# Patient Record
Sex: Male | Born: 1990 | Race: Black or African American | Hispanic: No | Marital: Single | Smoking: Current every day smoker
Health system: Southern US, Community
[De-identification: ages and names within clinical notes are randomized; demographics above are authoritative.]

---
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
## 2011-05-11 ENCOUNTER — Emergency Department: Payer: Self-pay | Admitting: Emergency Medicine

## 2014-06-13 ENCOUNTER — Emergency Department: Payer: Self-pay | Admitting: Emergency Medicine

## 2014-06-16 ENCOUNTER — Emergency Department: Payer: Self-pay | Admitting: Emergency Medicine

## 2014-07-13 ENCOUNTER — Emergency Department: Payer: Self-pay | Admitting: Emergency Medicine

## 2014-12-07 ENCOUNTER — Emergency Department: Admit: 2014-12-07 | Discharge status: Against Medical Advice | Payer: Self-pay | Admitting: Emergency Medicine

## 2014-12-07 LAB — CBC
HCT: 40.7 % (ref 40.0–52.0)
HGB: 13.1 g/dL (ref 13.0–18.0)
MCH: 26.9 pg (ref 26.0–34.0)
MCHC: 32.3 g/dL (ref 32.0–36.0)
MCV: 83 fL (ref 80–100)
PLATELETS: 297 10*3/uL (ref 150–440)
RBC: 4.88 10*6/uL (ref 4.40–5.90)
RDW: 14 % (ref 11.5–14.5)
WBC: 10 10*3/uL (ref 3.8–10.6)

## 2014-12-07 LAB — BASIC METABOLIC PANEL
ANION GAP: 5 — AB (ref 7–16)
BUN: 8 mg/dL
Calcium, Total: 9.1 mg/dL
Chloride: 104 mmol/L
Co2: 29 mmol/L
Creatinine: 0.93 mg/dL
EGFR (Non-African Amer.): >60
Glucose: 85 mg/dL
Potassium: 3.2 mmol/L — ABNORMAL LOW
SODIUM: 138 mmol/L

## 2014-12-07 LAB — TROPONIN I: Troponin-I: <0.03 ng/mL

## 2016-09-11 ENCOUNTER — Emergency Department
Admission: EM | Admit: 2016-09-11 | Discharge: 2016-09-11 | Disposition: A | Discharge status: Home / Self Care | Payer: Self-pay | Attending: Emergency Medicine | Admitting: Emergency Medicine

## 2016-09-11 DIAGNOSIS — Z5321 Procedure and treatment not carried out due to patient leaving prior to being seen by health care provider: Secondary | ICD-10-CM | POA: Insufficient documentation

## 2016-12-26 ENCOUNTER — Emergency Department
Admission: EM | Admit: 2016-12-26 | Discharge: 2016-12-26 | Disposition: A | Discharge status: Home / Self Care | Payer: Self-pay | Attending: Emergency Medicine | Admitting: Emergency Medicine

## 2016-12-26 ENCOUNTER — Encounter: Payer: Self-pay | Admitting: Emergency Medicine

## 2016-12-26 DIAGNOSIS — K219 Gastro-esophageal reflux disease without esophagitis: Secondary | ICD-10-CM | POA: Insufficient documentation

## 2016-12-26 MED ORDER — GI COCKTAIL ~~LOC~~
30.0000 mL | Freq: Once | ORAL | Status: AC
  Administered 2016-12-26: 30 mL via ORAL
  Filled 2016-12-26: qty 30

## 2016-12-26 MED ORDER — FAMOTIDINE 20 MG PO TABS: 20.0000 mg | ORAL_TABLET | Freq: Every day | ORAL | 0 refills | Status: DC

## 2016-12-26 NOTE — ED Triage Notes (Signed)
Pt reports nausea and heartburn that began this morning. Pt reports diarrhea three times today. No apparent distress noted in triage.

## 2016-12-26 NOTE — ED Notes (Signed)

## 2016-12-26 NOTE — ED Provider Notes (Signed)
[facility] Emergency Department Provider Note   ____________________________________________   First MD Initiated Contact with Patient 12/26/16 1858     (approximate)  I have reviewed the triage vital signs and the nursing notes.   HISTORY  Chief Complaint Nausea and Heartburn    HPI ARBOR COHEN is a 26 y.o. male patient complaining of epigastric pain that began this morning. Patient described the pain as "burning". Patient state there is nausea but no vomiting. Patient also noticed loose stools 3 episodes today. No palliative measures taken for this complaint. Patient rates his pain as a 4/10.patient admits to eating leftover pizza last night before retiring to bed.  History reviewed. No pertinent past medical history.  There are no active problems to display for this patient.   No past surgical history on file.  Prior to Admission medications   Medication Sig Start Date End Date Taking? Authorizing Provider  famotidine (PEPCID) 20 MG tablet Take 1 tablet (20 mg total) by mouth daily. 12/26/16 12/26/17  Joni Reining, PA-C    Allergies Patient has no known allergies.  No family history on file.  Social History Social History  Substance Use Topics  . Smoking status: Not on file  . Smokeless tobacco: Not on file  . Alcohol use Not on file    Review of Systems  Constitutional: No fever/chills Eyes: No visual changes. ENT: No sore throat. Cardiovascular: Denies chest pain. Respiratory: Denies shortness of breath. Gastrointestinal: epigastric pain. Nausea without vomiting.  3 episodes of loose stools. Genitourinary: Negative for dysuria. Musculoskeletal: Negative for back pain. Skin: Negative for rash. Neurological: Negative for headaches, focal weakness or numbness.   ____________________________________________   PHYSICAL EXAM:  VITAL SIGNS: ED Triage Vitals  Enc Vitals Group     BP 12/26/16 1837 (!) 178/81     Pulse  Rate 12/26/16 1837 69     Resp 12/26/16 1837 18     Temp 12/26/16 1837 98.7 F (37.1 C)     Temp Source 12/26/16 1837 Oral     SpO2 12/26/16 1837 100 %     Weight 12/26/16 1838 155 lb (70.3 kg)     Height 12/26/16 1838 5\' 9"  (1.753 m)     Head Circumference --      Peak Flow --      Pain Score 12/26/16 1837 4     Pain Loc --      Pain Edu? --      Excl. in GC? --     Constitutional: Alert and oriented. Well appearing and in no acute distress. Mouth/Throat: Mucous membranes are moist.  Oropharynx non-erythematous. Cardiovascular: Normal rate, regular rhythm. Grossly normal heart sounds.  Good peripheral circulation. Elevated blood pressure Respiratory: Normal respiratory effort.  No retractions. Lungs CTAB. Gastrointestinal: Soft and nontender. No distention. Hyperactive bowel sounds. Neurologic:  Normal speech and language. No gross focal neurologic deficits are appreciated. No gait instability. Skin:  Skin is warm, dry and intact. No rash noted. Psychiatric: Mood and affect are normal. Speech and behavior are normal.  ____________________________________________   LABS (all labs ordered are listed, but only abnormal results are displayed)  Labs Reviewed - No data to display ____________________________________________  EKG   ____________________________________________  RADIOLOGY   ____________________________________________   PROCEDURES  Procedure(s) performed: None  Procedures  Critical Care performed: No  ____________________________________________   INITIAL IMPRESSION / ASSESSMENT AND PLAN / ED COURSE  Pertinent labs & imaging results that were available during my care of  the patient were reviewed by me and considered in my medical decision making (see chart for details).  Gastric reflux. Patient given discharge Instructions. Patient given a work note. [facility].       ____________________________________________   FINAL CLINICAL IMPRESSION(S) / ED DIAGNOSES  Final diagnoses:  Gastroesophageal reflux disease without esophagitis      NEW MEDICATIONS STARTED DURING THIS VISIT:  New Prescriptions   FAMOTIDINE (PEPCID) 20 MG TABLET    Take 1 tablet (20 mg total) by mouth daily.     Note:  This document was prepared using Dragon voice recognition software and may include unintentional dictation errors.    Joni ReiningSmith, Ronald K, PA-C 12/26/16 1918    Loleta RoseForbach, Cory, MD 12/26/16 2211

## 2017-02-05 ENCOUNTER — Emergency Department
Admission: EM | Admit: 2017-02-05 | Discharge: 2017-02-05 | Disposition: A | Discharge status: Home / Self Care | Payer: Self-pay | Attending: Emergency Medicine | Admitting: Emergency Medicine

## 2017-02-05 DIAGNOSIS — S51819A Laceration without foreign body of unspecified forearm, initial encounter: Secondary | ICD-10-CM

## 2017-02-05 DIAGNOSIS — Y999 Unspecified external cause status: Secondary | ICD-10-CM | POA: Insufficient documentation

## 2017-02-05 DIAGNOSIS — S51811A Laceration without foreign body of right forearm, initial encounter: Secondary | ICD-10-CM | POA: Insufficient documentation

## 2017-02-05 DIAGNOSIS — Z23 Encounter for immunization: Secondary | ICD-10-CM | POA: Insufficient documentation

## 2017-02-05 DIAGNOSIS — Y939 Activity, unspecified: Secondary | ICD-10-CM | POA: Insufficient documentation

## 2017-02-05 DIAGNOSIS — S51822A Laceration with foreign body of left forearm, initial encounter: Secondary | ICD-10-CM | POA: Insufficient documentation

## 2017-02-05 DIAGNOSIS — F17200 Nicotine dependence, unspecified, uncomplicated: Secondary | ICD-10-CM | POA: Insufficient documentation

## 2017-02-05 DIAGNOSIS — Y929 Unspecified place or not applicable: Secondary | ICD-10-CM | POA: Insufficient documentation

## 2017-02-05 MED ORDER — LIDOCAINE HCL (PF) 1 % IJ SOLN
5.0000 mL | Freq: Once | INTRAMUSCULAR | Status: AC
  Administered 2017-02-05: 5 mL via INTRADERMAL
  Filled 2017-02-05: qty 5

## 2017-02-05 MED ORDER — TETANUS-DIPHTH-ACELL PERTUSSIS 5-2.5-18.5 LF-MCG/0.5 IM SUSP
0.5000 mL | Freq: Once | INTRAMUSCULAR | Status: AC
  Administered 2017-02-05: 0.5 mL via INTRAMUSCULAR
  Filled 2017-02-05: qty 0.5

## 2017-02-05 MED ORDER — DOXYCYCLINE HYCLATE 50 MG PO CAPS: 50.0000 mg | ORAL_CAPSULE | Freq: Two times a day (BID) | ORAL | 0 refills | Status: DC

## 2017-02-05 NOTE — ED Notes (Signed)
[employer] at bedside 

## 2017-02-05 NOTE — Discharge Instructions (Signed)
Please seek medical attention for any high fevers, chest pain, shortness of breath, change in behavior, persistent vomiting, bloody stool or any other new or concerning symptoms.  

## 2017-02-05 NOTE — ED Notes (Addendum)
ED Provider at bedside.. MD Archie Balboa with suture kit for R arm
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
## 2017-02-05 NOTE — ED Notes (Signed)
ED Provider at bedside. MD Archie Balboa w/ suture kit for L arm

## 2017-11-05 ENCOUNTER — Emergency Department
Admission: EM | Admit: 2017-11-05 | Discharge: 2017-11-05 | Disposition: A | Discharge status: Home / Self Care | Payer: Self-pay | Attending: Emergency Medicine | Admitting: Emergency Medicine

## 2017-11-05 ENCOUNTER — Encounter: Payer: Self-pay | Admitting: Emergency Medicine

## 2017-11-05 ENCOUNTER — Other Ambulatory Visit: Payer: Self-pay

## 2017-11-05 DIAGNOSIS — F17200 Nicotine dependence, unspecified, uncomplicated: Secondary | ICD-10-CM | POA: Insufficient documentation

## 2017-11-05 DIAGNOSIS — J0100 Acute maxillary sinusitis, unspecified: Secondary | ICD-10-CM | POA: Insufficient documentation

## 2017-11-05 DIAGNOSIS — J069 Acute upper respiratory infection, unspecified: Secondary | ICD-10-CM | POA: Insufficient documentation

## 2017-11-05 LAB — INFLUENZA PANEL BY PCR (TYPE A & B)
INFLAPCR: NEGATIVE
Influenza B By PCR: NEGATIVE

## 2017-11-05 MED ORDER — AZITHROMYCIN 250 MG PO TABS: ORAL_TABLET | ORAL | 0 refills | Status: DC

## 2017-11-05 NOTE — ED Notes (Signed)
See triage note  States he developed some body aches and subjective fever at home  Afebrile on arrival

## 2017-11-05 NOTE — ED Provider Notes (Signed)
[facility] Emergency Department Provider Note  ____________________________________________   First MD Initiated Contact with Patient 11/05/17 1543     (approximate)  I have reviewed the triage vital signs and the nursing notes.   HISTORY  Chief Complaint No chief complaint on file.    HPI Thomas Rowe is a 27 y.o. male complains of body aches and fever for 1 at home.  He states he aches all over.  Symptoms for about 3 days.  He [facility] and told he did not have the flu or strep but since then he has become hot and cold.  He denies any vomiting or diarrhea.  Denies chest pain or shortness of breath  History reviewed. No pertinent past medical history.  There are no active problems to display for this patient.   History reviewed. No pertinent surgical history.  Prior to Admission medications   Medication Sig Start Date End Date Taking? Authorizing Provider  azithromycin (ZITHROMAX Z-PAK) 250 MG tablet 2 pills today then 1 pill a day for 4 days 11/05/17   Faythe GheeFisher, Nixon Kolton W, PA-C    Allergies Patient has no known allergies.  No family history on file.  Social History Social History   Tobacco Use  . Smoking status: Current Every Day Smoker    Packs/day: 0.50  . Smokeless tobacco: Never Used  Substance Use Topics  . Alcohol use: Never    Frequency: Never  . Drug use: Never    Review of Systems  Constitutional: Positive fever/chills Eyes: No visual changes. ENT: Positive sore throat.  Positive for sinus congestion with clear to bloody mucus Respiratory: Mild cough Gastrointestinal: Denies vomiting or diarrhea Genitourinary: Negative for dysuria. Musculoskeletal: Negative for back pain. Skin: Negative for rash.    ____________________________________________   PHYSICAL EXAM:  VITAL SIGNS: ED Triage Vitals  Enc Vitals Group     BP 11/05/17 1523 135/71     Pulse Rate 11/05/17 1523 99     Resp 11/05/17 1523 16      Temp 11/05/17 1523 98.7 F (37.1 C)     Temp Source 11/05/17 1523 Oral     SpO2 11/05/17 1523 100 %     Weight 11/05/17 1524 160 lb (72.6 kg)     Height 11/05/17 1524 5\' 9"  (1.753 m)     Head Circumference --      Peak Flow --      Pain Score 11/05/17 1523 4     Pain Loc --      Pain Edu? --      Excl. in GC? --     Constitutional: Alert and oriented. Well appearing and in no acute distress. Eyes: Conjunctivae are normal.  Head: Atraumatic. Ears: TMs are clear bilaterally Nose: No congestion/rhinnorhea. Mouth/Throat: Mucous membranes are moist.  Throat is normal.  There is no redness or pus noted  Cardiovascular: Normal rate, regular rhythm.  Heart sounds are normal Respiratory: Normal respiratory effort.  No retractions, lungs clear to auscultation GU: deferred Musculoskeletal: FROM all extremities, warm and well perfused Neurologic:  Normal speech and language.  Skin:  Skin is warm, dry and intact. No rash noted. Psychiatric: Mood and affect are normal. Speech and behavior are normal.  ____________________________________________   LABS (all labs ordered are listed, but only abnormal results are displayed)  Labs Reviewed  INFLUENZA PANEL BY PCR (TYPE A & B)   ____________________________________________   ____________________________________________  RADIOLOGY    ____________________________________________   PROCEDURES  Procedure(s) performed: No  Procedures    ____________________________________________   INITIAL IMPRESSION / ASSESSMENT AND PLAN / ED COURSE  Pertinent labs & imaging results that were available during my care of the patient were reviewed by me and considered in my medical decision making (see chart for details).  Patient is a 27 year old male presents emergency department complaining of flulike symptoms.  Physical exam he appears well.  He is afebrile at this time.  Flu test ordered by nursing staff   Flu test is  negative.   Test results are explained to the patient.  He was given a diagnosis of acute upper respiratory and sinusitis.  Given a prescription for Z-Pak.  A work note which seem to be mostly important to him.  And he was discharged in stable condition  As part of my medical decision making, I reviewed the following data within the electronic MEDICAL RECORD NUMBER Nursing notes reviewed and incorporated, Labs reviewed flu test is negative, Notes from prior ED visits and [place] Controlled Substance Database  ____________________________________________   FINAL CLINICAL IMPRESSION(S) / ED DIAGNOSES  Final diagnoses:  Acute URI  Acute maxillary sinusitis, recurrence not specified      NEW MEDICATIONS STARTED DURING THIS VISIT:  Discharge Medication List as of 11/05/2017  4:14 PM    START taking these medications   Details  azithromycin (ZITHROMAX Z-PAK) 250 MG tablet 2 pills today then 1 pill a day for 4 days, Print         Note:  This document was prepared using Dragon voice recognition software and may include unintentional dictation errors.    Faythe Ghee, PA-C 11/05/17 1721    Phineas Semen, MD 11/05/17 [phone]

## 2017-11-05 NOTE — ED Triage Notes (Addendum)
PT to ED via POV with c/o body aches and fevers at home x3days. Pt ambulatory, VSS

## 2017-11-12 ENCOUNTER — Emergency Department: Payer: Self-pay

## 2017-11-12 ENCOUNTER — Other Ambulatory Visit: Payer: Self-pay

## 2017-11-12 ENCOUNTER — Encounter: Payer: Self-pay | Admitting: Emergency Medicine

## 2017-11-12 ENCOUNTER — Emergency Department
Admission: EM | Admit: 2017-11-12 | Discharge: 2017-11-12 | Disposition: A | Discharge status: Home / Self Care | Payer: Self-pay | Attending: Emergency Medicine | Admitting: Emergency Medicine

## 2017-11-12 DIAGNOSIS — Z5321 Procedure and treatment not carried out due to patient leaving prior to being seen by health care provider: Secondary | ICD-10-CM | POA: Insufficient documentation

## 2017-11-12 DIAGNOSIS — R079 Chest pain, unspecified: Secondary | ICD-10-CM | POA: Insufficient documentation

## 2017-11-12 DIAGNOSIS — R0602 Shortness of breath: Secondary | ICD-10-CM | POA: Insufficient documentation

## 2017-11-12 LAB — CBC
HCT: 38.8 % — ABNORMAL LOW (ref 40.0–52.0)
Hemoglobin: 12.7 g/dL — ABNORMAL LOW (ref 13.0–18.0)
MCH: 26.6 pg (ref 26.0–34.0)
MCHC: 32.8 g/dL (ref 32.0–36.0)
MCV: 81.1 fL (ref 80.0–100.0)
Platelets: 451 10*3/uL — ABNORMAL HIGH (ref 150–440)
RBC: 4.79 MIL/uL (ref 4.40–5.90)
RDW: 13.2 % (ref 11.5–14.5)
WBC: 13.2 10*3/uL — ABNORMAL HIGH (ref 3.8–10.6)

## 2017-11-12 LAB — BASIC METABOLIC PANEL
ANION GAP: 10 (ref 5–15)
BUN: 7 mg/dL (ref 6–20)
CO2: 23 mmol/L (ref 22–32)
Calcium: 9.4 mg/dL (ref 8.9–10.3)
Chloride: 107 mmol/L (ref 101–111)
Creatinine, Ser: 1.02 mg/dL (ref 0.61–1.24)
GFR calc Af Amer: >60 mL/min (ref >60)
GFR calc non Af Amer: >60 mL/min (ref >60)
GLUCOSE: 104 mg/dL — AB (ref 65–99)
POTASSIUM: 3.1 mmol/L — AB (ref 3.5–5.1)
Sodium: 140 mmol/L (ref 135–145)

## 2017-11-12 LAB — TROPONIN I: Troponin I: <0.03 ng/mL (ref <0.03)

## 2017-11-12 NOTE — ED Triage Notes (Signed)
Pt arrived to the ED for complaints of chest pain and coughing. Pt reports that he was at work when he began to cough uncontrollably which made him start having chest pain/tighness with a tingly sensation on his fingers. Pt states that it went away and it returned, that is when he decided to come to the ED. Pt is AOx4 during triage, Pt appears to be anxious. MD was notified.

## 2017-11-13 ENCOUNTER — Telehealth: Payer: Self-pay | Admitting: Emergency Medicine

## 2017-11-13 NOTE — Telephone Encounter (Signed)
Called patient due to lwot to inquire about condition and follow up plans. Left message with his father.

## 2017-12-20 ENCOUNTER — Emergency Department: Payer: Self-pay

## 2017-12-20 ENCOUNTER — Emergency Department
Admission: EM | Admit: 2017-12-20 | Discharge: 2017-12-20 | Disposition: A | Discharge status: Home / Self Care | Payer: Self-pay | Attending: Emergency Medicine | Admitting: Emergency Medicine

## 2017-12-20 ENCOUNTER — Other Ambulatory Visit: Payer: Self-pay

## 2017-12-20 DIAGNOSIS — M545 Low back pain, unspecified: Secondary | ICD-10-CM

## 2017-12-20 DIAGNOSIS — F17200 Nicotine dependence, unspecified, uncomplicated: Secondary | ICD-10-CM | POA: Insufficient documentation

## 2017-12-20 MED ORDER — ETODOLAC 200 MG PO CAPS: 200.0000 mg | ORAL_CAPSULE | Freq: Three times a day (TID) | ORAL | 0 refills | Status: DC

## 2017-12-20 MED ORDER — KETOROLAC TROMETHAMINE 60 MG/2ML IM SOLN
60.0000 mg | Freq: Once | INTRAMUSCULAR | Status: AC
  Administered 2017-12-20: 60 mg via INTRAMUSCULAR
  Filled 2017-12-20: qty 2

## 2017-12-20 MED ORDER — LIDOCAINE 5 % EX PTCH: 1.0000 | MEDICATED_PATCH | Freq: Two times a day (BID) | CUTANEOUS | 0 refills | Status: AC

## 2017-12-20 NOTE — ED Provider Notes (Signed)
[facility] Emergency Department Provider Note   ____________________________________________   First MD Initiated Contact with Patient 12/20/17 [phone]     (approximate)  I have reviewed the triage vital signs and the nursing notes.   HISTORY  Chief Complaint Back Pain    HPI Thomas Rowe is a 27 y.o. male who comes into the hospital today with some back pain.  He reports that his back is sore when he is walking.  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  He reports that his back is been hurting all day and is been getting worse and worse as the day wore on.  He has not taken anything for pain.  He reports that the pain is in his right lower back and extends to the middle.  He states that his legs are always weak but they are not more weak than normal.  He also denies any tingling or other symptoms.  The patient rates his pain a 5 out of 10 in intensity.  He is here for evaluation.  The patient has no fecal incontinence or urinary retention  History reviewed. No pertinent past medical history.  There are no active problems to display for this patient.   History reviewed. No pertinent surgical history.  Prior to Admission medications   Medication Sig Start Date End Date Taking? Authorizing Provider  azithromycin (ZITHROMAX Z-PAK) 250 MG tablet 2 pills today then 1 pill a day for 4 days 11/05/17   Sherrie Mustache Roselyn Bering, PA-C  etodolac (LODINE) 200 MG capsule Take 1 capsule (200 mg total) by mouth every 8 (eight) hours. 12/20/17   Rebecka Apley, MD  lidocaine (LIDODERM) 5 % Place 1 patch onto the skin every 12 (twelve) hours. Remove & Discard patch within 12 hours or as directed by MD 12/20/17 12/20/18  Rebecka Apley, MD    Allergies Patient has no known allergies.  No family history on file.  Social History Social History   Tobacco Use  . Smoking status: Current Every Day Smoker   Packs/day: 0.50  . Smokeless tobacco: Never Used  Substance Use Topics  . Alcohol use: Never    Frequency: Never  . Drug use: Never    Review of Systems  Constitutional: No fever/chills Eyes: No visual changes. ENT: No sore throat. Cardiovascular: Denies chest pain. Respiratory: Denies shortness of breath. Gastrointestinal: No abdominal pain.  No nausea, no vomiting.   Genitourinary: Negative for dysuria. Musculoskeletal:  back pain. Skin: Negative for rash. Neurological: Negative for headaches, focal weakness or numbness.   ____________________________________________   PHYSICAL EXAM:  VITAL SIGNS: ED Triage Vitals [12/20/17 0429]  Enc Vitals Group     BP (!) 150/88     Pulse Rate (!) 56     Resp 18     Temp 98.1 F (36.7 C)     Temp Source Oral     SpO2 100 %     Weight 160 lb (72.6 kg)     Height      Head Circumference      Peak Flow      Pain Score 5     Pain Loc      Pain Edu?      Excl. in GC?     Constitutional: Alert and oriented. Well appearing and in mild distress. Eyes: Conjunctivae are normal. PERRL. EOMI. Head: Atraumatic. Nose: No congestion/rhinnorhea. Mouth/Throat: Mucous membranes  are moist.  Oropharynx non-erythematous. Cardiovascular: Normal rate, regular rhythm. Grossly normal heart sounds.  Good peripheral circulation. Respiratory: Normal respiratory effort.  No retractions. Lungs CTAB. Gastrointestinal: Soft and nontender. No distention.  Musculoskeletal: Tenderness to palpation of right back and midline, negative straight leg raise Neurologic:  Normal speech and language.  Skin:  Skin is warm, dry and intact.  Psychiatric: Mood and affect are normal. .  ____________________________________________   LABS (all labs ordered are listed, but only abnormal results are displayed)  Labs Reviewed - No data to  display ____________________________________________  EKG  none ____________________________________________  RADIOLOGY  ED MD interpretation: DG lumbar spine: Negative  Official radiology report(s): Dg Lumbar Spine 2-3 Views  Result Date: 12/20/2017 CLINICAL DATA:  27 year old male with back pain. EXAM: LUMBAR SPINE - 2-3 VIEW COMPARISON:  None. FINDINGS: There is no evidence of lumbar spine fracture. Alignment is normal. Intervertebral disc spaces are maintained. IMPRESSION: Negative. Electronically Signed   By: Elgie Collard M.D.   On: 12/20/2017 05:19    ____________________________________________   PROCEDURES  Procedure(s) performed: None  Procedures  Critical Care performed: No  ____________________________________________   INITIAL IMPRESSION / ASSESSMENT AND PLAN / ED COURSE  As part of my medical decision making, I reviewed the following data within the electronic MEDICAL RECORD NUMBER Notes from prior ED visits and [place] Controlled Substance Database   This is a 27 year old male who comes into the hospital today with some right-sided back pain.  The patient states that he has been lifting heavy things at work.  I will give the patient a shot of Toradol and send him for an x-ray.  I feel that the patient likely has some musculoskeletal pain but given him midline pain I will send him for an x-ray.  He will be reassessed.  The patient's x-ray is negative.  He will be discharged home to follow-up with the acute care clinic.      ____________________________________________   FINAL CLINICAL IMPRESSION(S) / ED DIAGNOSES  Final diagnoses:  Acute right-sided low back pain without sciatica     ED Discharge Orders        Ordered    etodolac (LODINE) 200 MG capsule  Every 8 hours     12/20/17 0530    lidocaine (LIDODERM) 5 %  Every 12 hours     12/20/17 0530       Note:  This document was prepared using Dragon voice recognition software and may include  unintentional dictation errors.    Rebecka Apley, MD 12/20/17 [phone]

## 2017-12-20 NOTE — ED Triage Notes (Signed)
Patient c/o mid/lower right back pain beginning at work this evening while lifting heavy objects. Patient reports he does not want to file worker's comp paperwork.

## 2017-12-20 NOTE — ED Notes (Signed)
Patient ambulatory to lobby with steady gait and NAD noted. Verbalized understanding of discharge instructions and follow-up care.  

## 2017-12-20 NOTE — Discharge Instructions (Addendum)
Please follow up at the acute care clinic for further evaluation of your back pain.

## 2018-05-06 ENCOUNTER — Other Ambulatory Visit: Payer: Self-pay

## 2018-05-06 ENCOUNTER — Encounter: Payer: Self-pay | Admitting: *Deleted

## 2018-05-06 DIAGNOSIS — R42 Dizziness and giddiness: Secondary | ICD-10-CM | POA: Insufficient documentation

## 2018-05-06 DIAGNOSIS — Z5321 Procedure and treatment not carried out due to patient leaving prior to being seen by health care provider: Secondary | ICD-10-CM | POA: Insufficient documentation

## 2018-05-06 NOTE — ED Triage Notes (Signed)
Pt ambulatory to triage.  Pt reports left ear feels clogged up and he is dizzy.  No earache.  No chest pain.  Pt also reports diff taking a deep breath.  No cough.  cig smoker.  Pt alert.

## 2018-05-07 ENCOUNTER — Emergency Department
Admission: EM | Admit: 2018-05-07 | Discharge: 2018-05-07 | Disposition: A | Discharge status: Home / Self Care | Payer: Self-pay | Attending: Emergency Medicine | Admitting: Emergency Medicine

## 2018-05-07 LAB — CBC
HCT: 37.9 % — ABNORMAL LOW (ref 40.0–52.0)
HEMOGLOBIN: 12.7 g/dL — AB (ref 13.0–18.0)
MCH: 27.8 pg (ref 26.0–34.0)
MCHC: 33.4 g/dL (ref 32.0–36.0)
MCV: 83.4 fL (ref 80.0–100.0)
PLATELETS: 323 10*3/uL (ref 150–440)
RBC: 4.55 MIL/uL (ref 4.40–5.90)
RDW: 14.4 % (ref 11.5–14.5)
WBC: 10 10*3/uL (ref 3.8–10.6)

## 2018-05-07 LAB — BASIC METABOLIC PANEL
ANION GAP: 5 (ref 5–15)
BUN: 9 mg/dL (ref 6–20)
CALCIUM: 9.1 mg/dL (ref 8.9–10.3)
CO2: 28 mmol/L (ref 22–32)
CREATININE: 0.93 mg/dL (ref 0.61–1.24)
Chloride: 106 mmol/L (ref 98–111)
GFR calc non Af Amer: >60 mL/min (ref >60)
Glucose, Bld: 94 mg/dL (ref 70–99)
Potassium: 4 mmol/L (ref 3.5–5.1)
SODIUM: 139 mmol/L (ref 135–145)

## 2018-05-07 LAB — TROPONIN I

## 2018-05-07 NOTE — ED Notes (Signed)
Pt has not returned to ED lobby 

## 2018-05-07 NOTE — ED Notes (Signed)
Pt noted leaving ED lobby with SO 

## 2019-10-05 IMAGING — CR DG CHEST 2V
1 series · 2 of 2 positions shown · non-contrast
Comparison: 05/11/2011

CLINICAL DATA: Chest pain

EXAM:
CHEST - 2 VIEW

[Series 1: dg chest 2 view · 0.14mm/px · 2 of 2 slices shown]
[im 1/2]
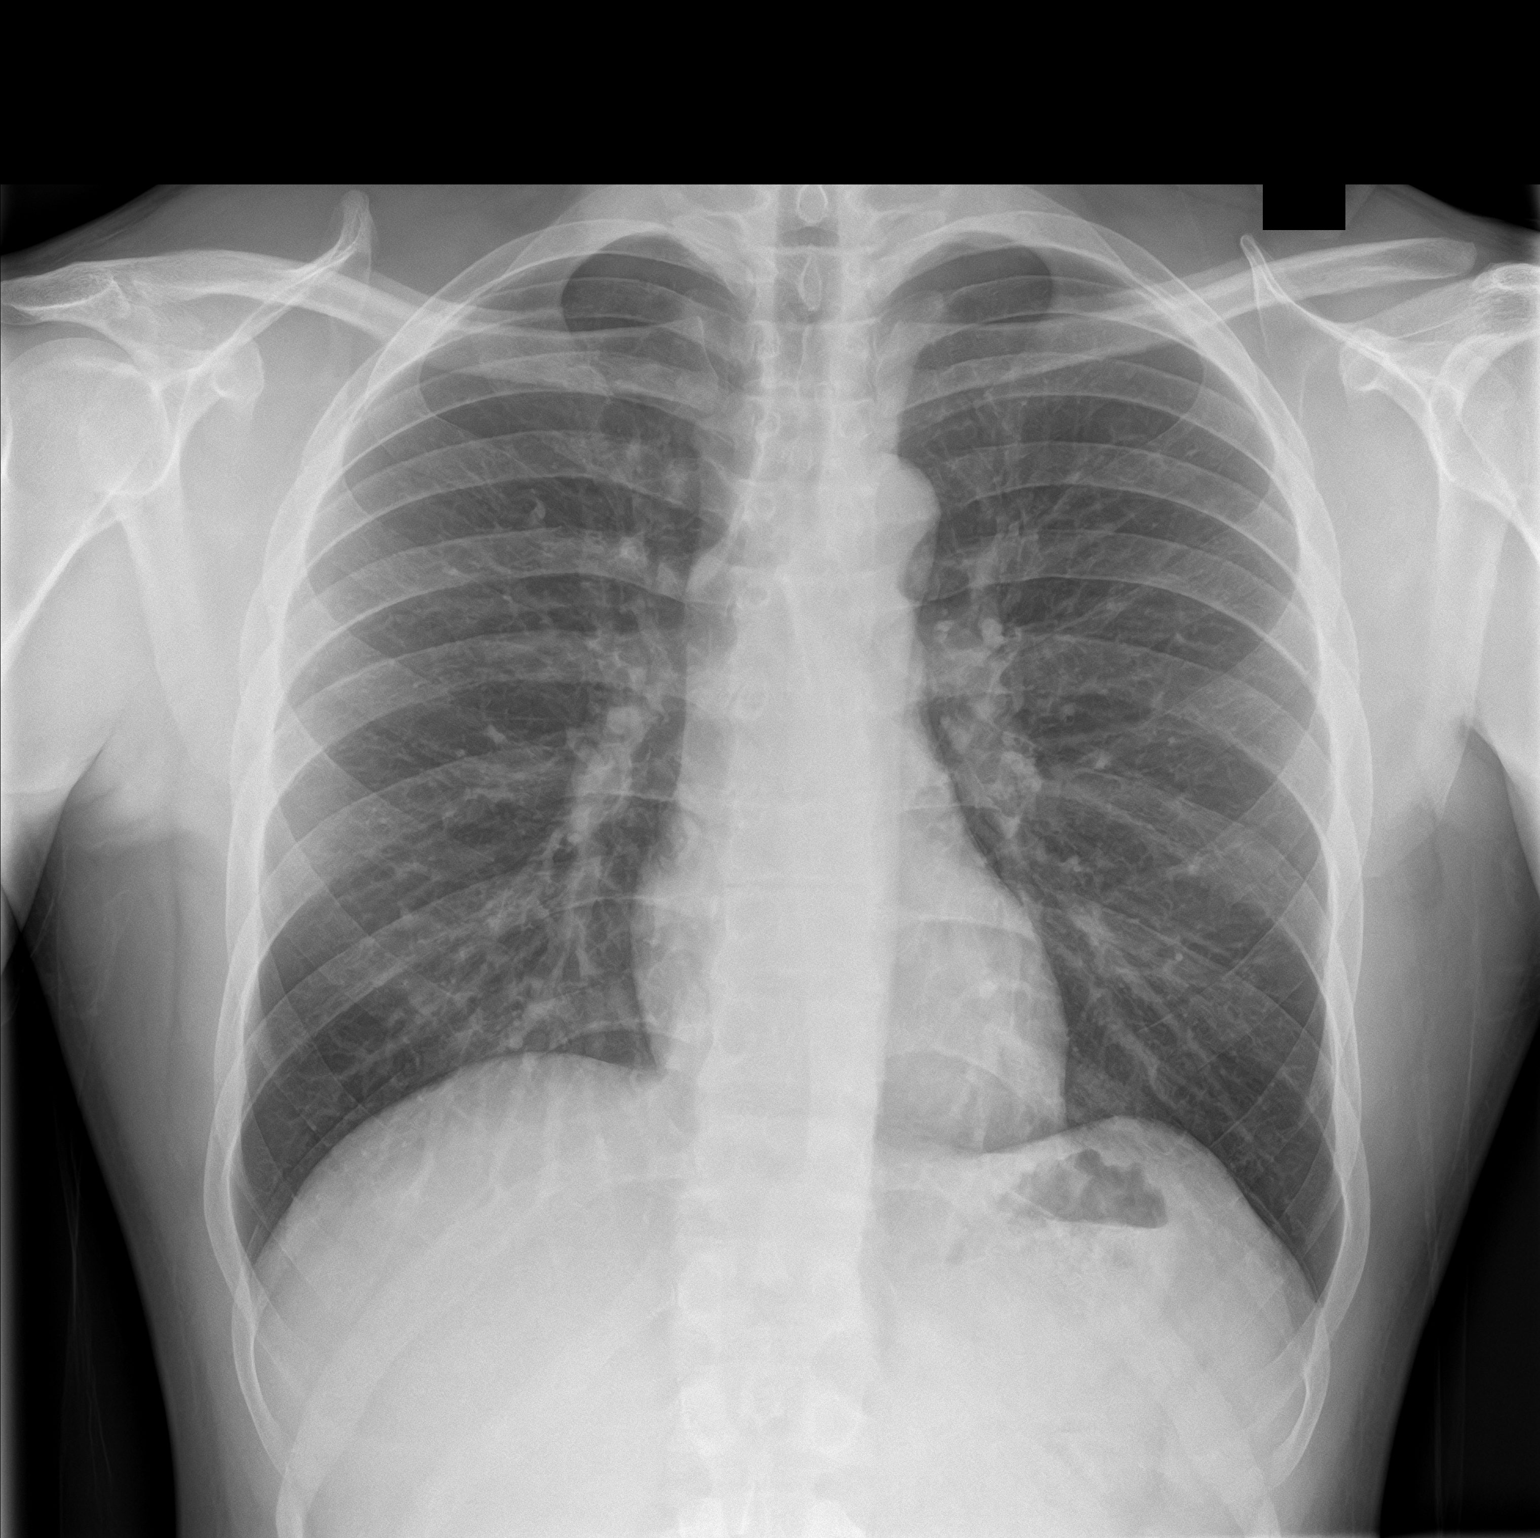
[im 2/2]
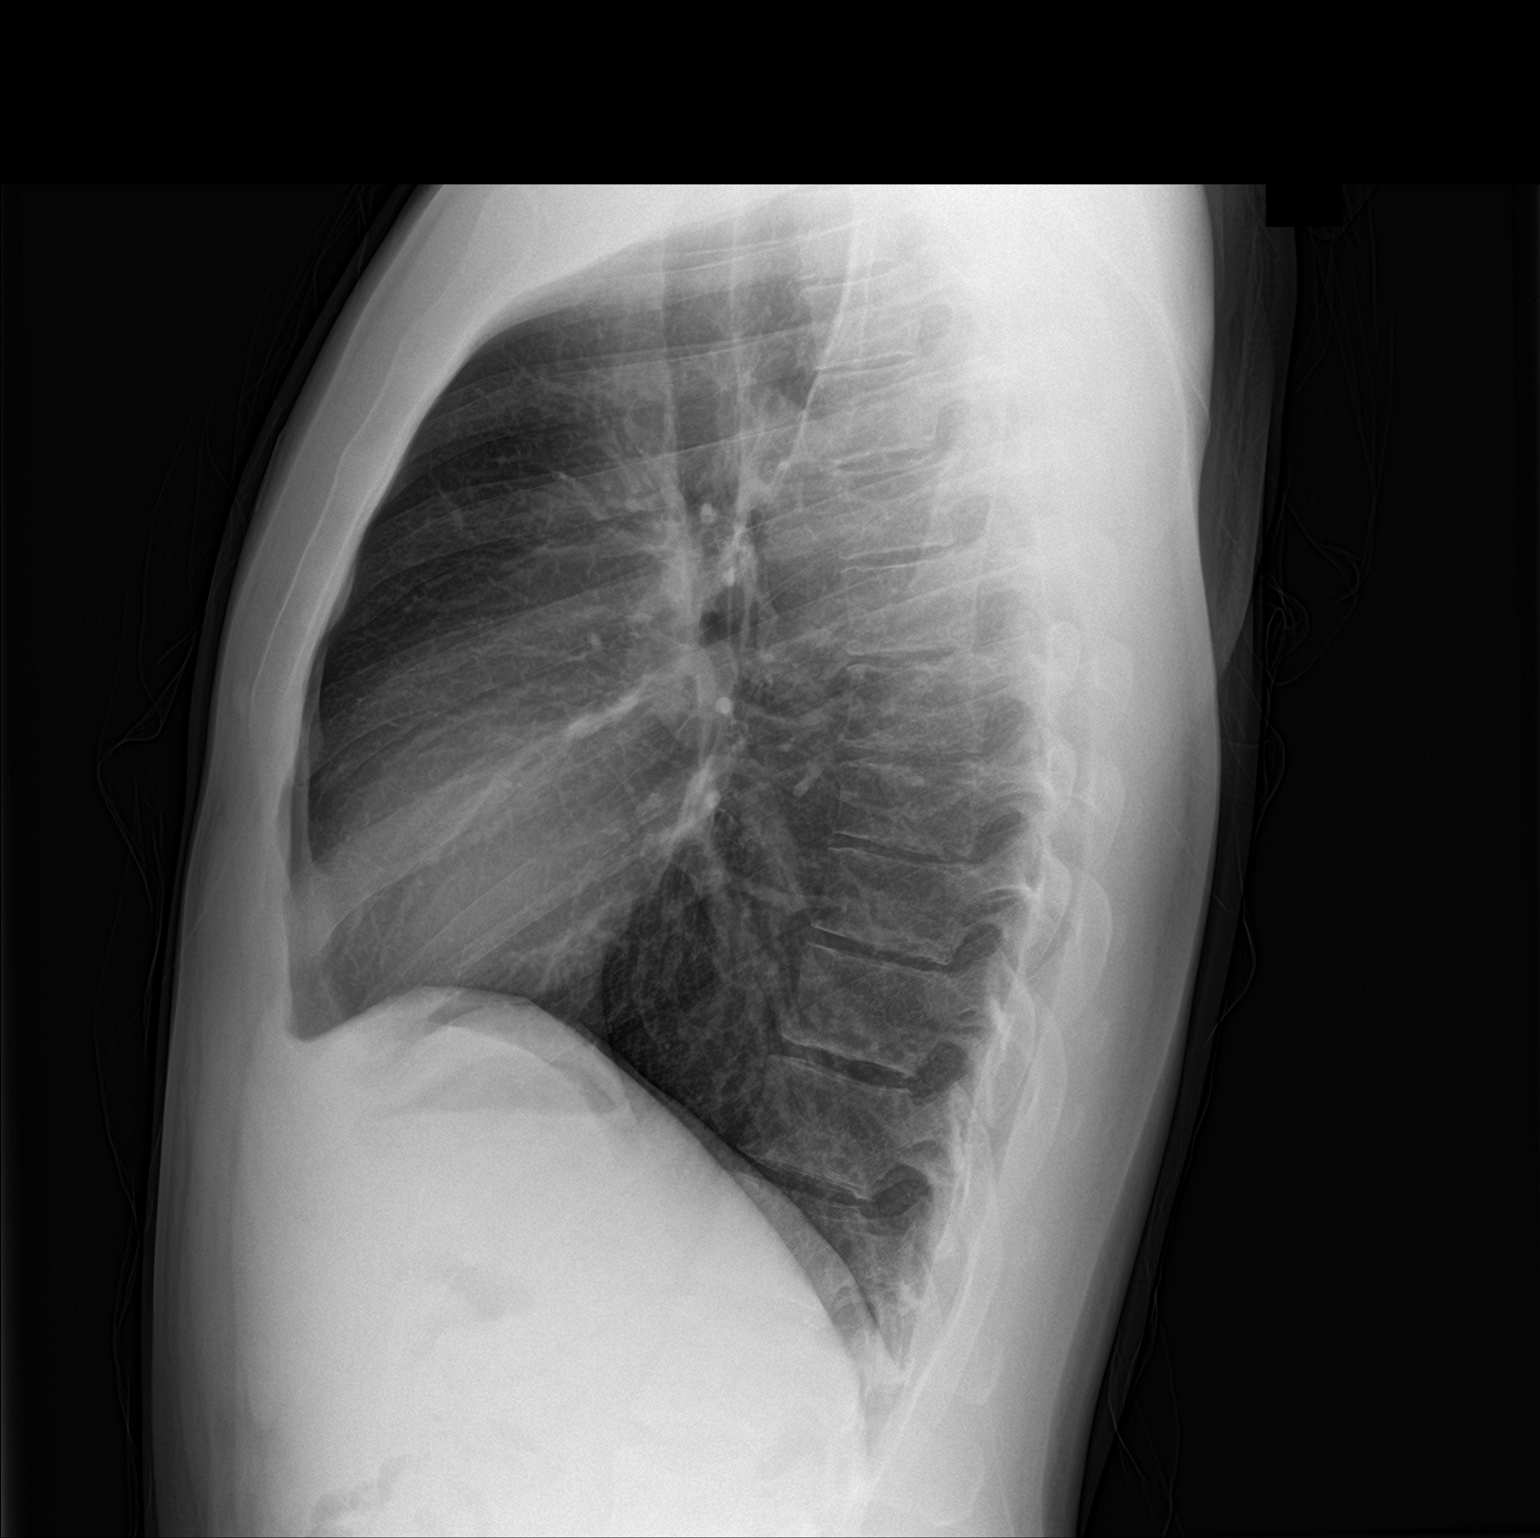

[2 of 2 positions shown; findings below may reference images not displayed]

FINDINGS: The heart size and mediastinal contours are within normal limits.
Both lungs are clear. The visualized skeletal structures are
unremarkable.
IMPRESSION: No active cardiopulmonary disease.

## 2019-11-12 IMAGING — CR DG LUMBAR SPINE 2-3V
3 series · 3 of 3 positions shown · non-contrast
Comparison: None.

CLINICAL DATA: 27-year-old male with back pain.

EXAM:
LUMBAR SPINE - 2-3 VIEW

[l-spine ap]
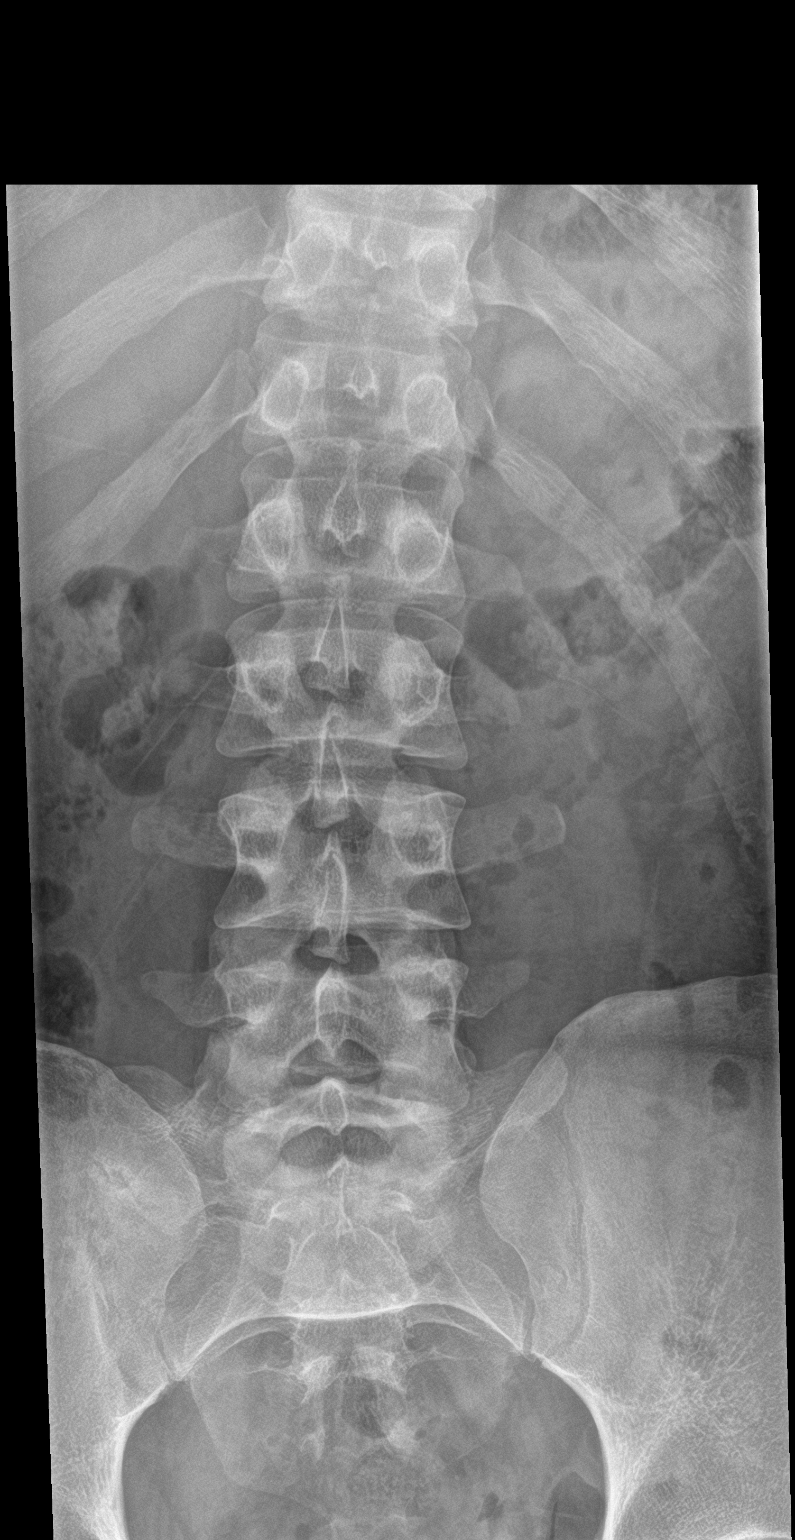

[l-spine lat]
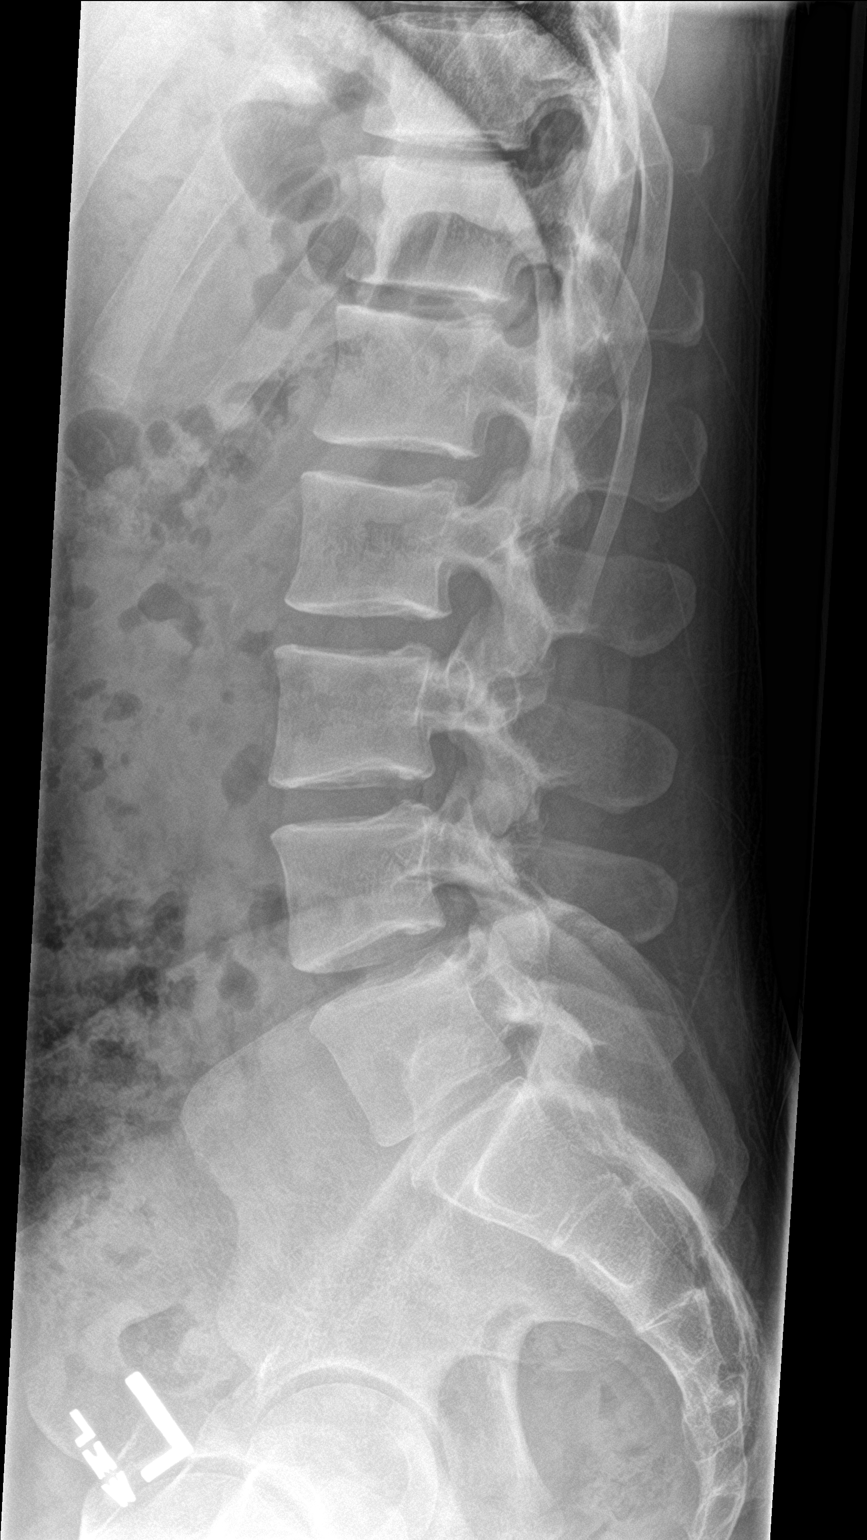

[l-spine spot]
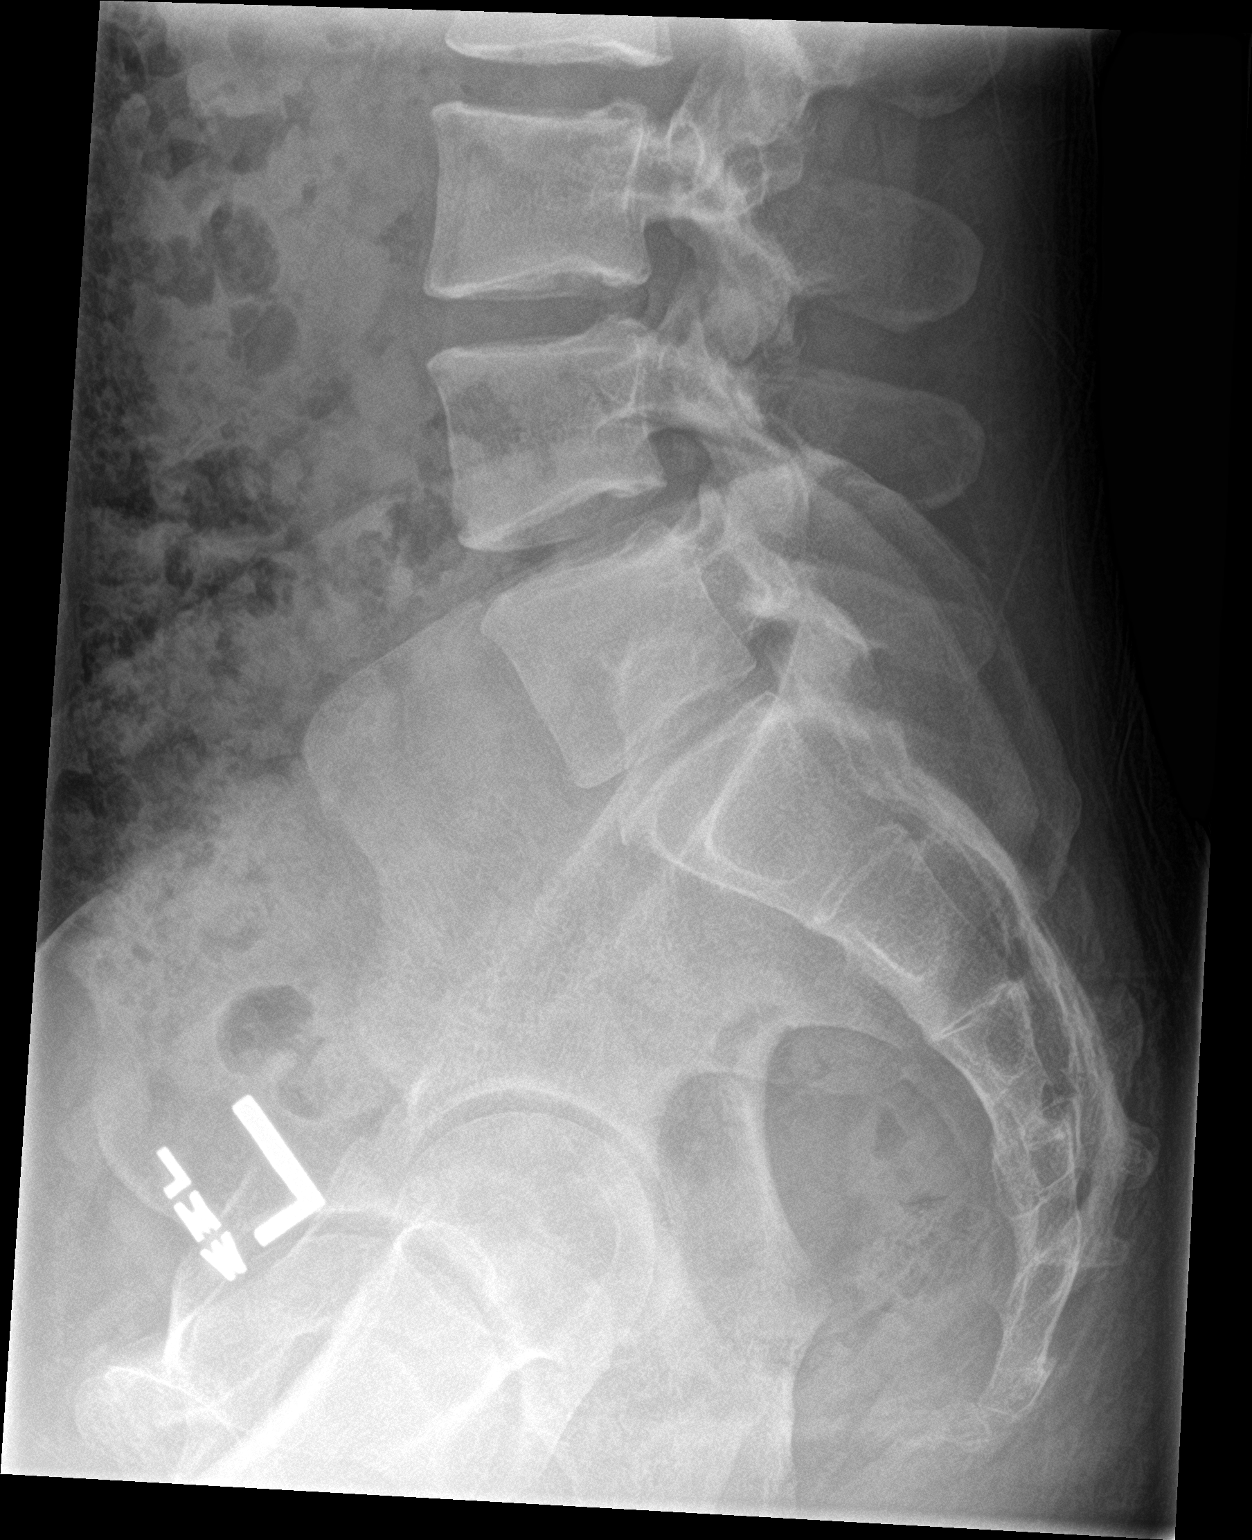

[3 of 3 positions shown; findings below may reference images not displayed]

FINDINGS: There is no evidence of lumbar spine fracture. Alignment is normal.
Intervertebral disc spaces are maintained.
IMPRESSION: Negative.

## 2021-06-21 ENCOUNTER — Emergency Department
Admission: EM | Admit: 2021-06-21 | Discharge: 2021-06-21 | Disposition: A | Discharge status: Home / Self Care | Payer: Self-pay | Attending: Emergency Medicine | Admitting: Emergency Medicine

## 2021-06-21 ENCOUNTER — Encounter: Payer: Self-pay | Admitting: Emergency Medicine

## 2021-06-21 ENCOUNTER — Other Ambulatory Visit: Payer: Self-pay

## 2021-06-21 DIAGNOSIS — H60511 Acute actinic otitis externa, right ear: Secondary | ICD-10-CM | POA: Insufficient documentation

## 2021-06-21 DIAGNOSIS — H60501 Unspecified acute noninfective otitis externa, right ear: Secondary | ICD-10-CM

## 2021-06-21 DIAGNOSIS — F17210 Nicotine dependence, cigarettes, uncomplicated: Secondary | ICD-10-CM | POA: Insufficient documentation

## 2021-06-21 DIAGNOSIS — H6011 Cellulitis of right external ear: Secondary | ICD-10-CM | POA: Insufficient documentation

## 2021-06-21 MED ORDER — DOXYCYCLINE HYCLATE 100 MG PO CAPS: 100.0000 mg | ORAL_CAPSULE | Freq: Two times a day (BID) | ORAL | 0 refills | Status: AC

## 2021-06-21 MED ORDER — CIPROFLOXACIN-DEXAMETHASONE 0.3-0.1 % OT SUSP: 4.0000 [drp] | Freq: Two times a day (BID) | OTIC | 0 refills | Status: AC

## 2021-06-21 NOTE — ED Provider Notes (Signed)
[facility] Emergency Department Provider Note   ____________________________________________   Event Date/Time   First MD Initiated Contact with Patient 06/21/21 1311     (approximate)  I have reviewed the triage vital signs and the nursing notes.   HISTORY  Chief Complaint Otalgia    HPI Thomas Rowe is a 30 y.o. male with no significant past medical history who presents to the ED complaining of ear pain.  Patient reports that he has had 3 days of worsening pain around the area of his right earlobe.  Patient states that he typically wears gauge earrings but began to notice redness and swelling over the lower part of his lobe a couple of days ago.  He took the gauge out and symptoms seem to improve, but then worsened again when he replaced the gauge.  He has started to notice drainage from the inner portion of his lobe, denies any pain or swelling at his upper ear.  He has not had any drainage from his ear canal, denies any fever or difficulty hearing.        History reviewed. No pertinent past medical history.  There are no problems to display for this patient.   History reviewed. No pertinent surgical history.  Prior to Admission medications   Medication Sig Start Date End Date Taking? Authorizing Provider  ciprofloxacin-dexamethasone (CIPRODEX) OTIC suspension Place 4 drops into the right ear 2 (two) times daily for 7 days. 06/21/21 06/28/21 Yes Chesley Noon, MD  doxycycline (VIBRAMYCIN) 100 MG capsule Take 1 capsule (100 mg total) by mouth 2 (two) times daily for 7 days. 06/21/21 06/28/21 Yes Chesley Noon, MD  azithromycin (ZITHROMAX Z-PAK) 250 MG tablet 2 pills today then 1 pill a day for 4 days 11/05/17   Sherrie Mustache Roselyn Bering, PA-C  etodolac (LODINE) 200 MG capsule Take 1 capsule (200 mg total) by mouth every 8 (eight) hours. 12/20/17   Rebecka Apley, MD    Allergies Patient has no known allergies.  History reviewed. No pertinent  family history.  Social History Social History   Tobacco Use   Smoking status: Every Day    Packs/day: 0.50    Types: Cigarettes   Smokeless tobacco: Never  Substance Use Topics   Alcohol use: Never   Drug use: Never    Review of Systems  Constitutional: No fever/chills Eyes: No visual changes. ENT: No sore throat.  Positive for right ear pain and swelling. Cardiovascular: Denies chest pain. Respiratory: Denies shortness of breath. Gastrointestinal: No abdominal pain.  No nausea, no vomiting.  No diarrhea.  No constipation. Genitourinary: Negative for dysuria. Musculoskeletal: Negative for back pain. Skin: Negative for rash. Neurological: Negative for headaches, focal weakness or numbness.  ____________________________________________   PHYSICAL EXAM:  VITAL SIGNS: ED Triage Vitals  Enc Vitals Group     BP 06/21/21 1259 140/90     Pulse Rate 06/21/21 1259 85     Resp 06/21/21 1259 16     Temp 06/21/21 1259 98.4 F (36.9 C)     Temp Source 06/21/21 1259 Oral     SpO2 06/21/21 1259 100 %     Weight 06/21/21 1300 160 lb 0.9 oz (72.6 kg)     Height 06/21/21 1300 5\' 8"  (1.727 m)     Head Circumference --      Peak Flow --      Pain Score 06/21/21 1300 5     Pain Loc --      Pain Edu? --  Excl. in GC? --     Constitutional: Alert and oriented. Eyes: Conjunctivae are normal. Ears: TMs clear bilaterally.  Edema, erythema, and tenderness noted to right earlobe with purulence draining from inner portion of lobe.  No drainage noted from right ear canal. Head: Atraumatic. Nose: No congestion/rhinnorhea. Mouth/Throat: Mucous membranes are moist. Neck: Normal ROM Cardiovascular: Normal rate, regular rhythm. Grossly normal heart sounds. Respiratory: Normal respiratory effort.  No retractions. Lungs CTAB. Gastrointestinal: Soft and nontender. No distention. Genitourinary: deferred Musculoskeletal: No lower extremity tenderness nor edema. Neurologic:  Normal speech  and language. No gross focal neurologic deficits are appreciated. Skin:  Skin is warm, dry and intact. No rash noted. Psychiatric: Mood and affect are normal. Speech and behavior are normal.  ____________________________________________   LABS (all labs ordered are listed, but only abnormal results are displayed)  Labs Reviewed - No data to display   PROCEDURES  Procedure(s) performed (including Critical Care):  Procedures   ____________________________________________   INITIAL IMPRESSION / ASSESSMENT AND PLAN / ED COURSE      30 year old male with no significant past medical history presents to the ED complaining of pain and swelling affecting his right earlobe, where he typically wears a gauge earring.  He has edema, erythema, warmth, tenderness, and drainage from the affected portion of his earlobe.  No evidence of otitis media and no drainage from the ear canal.  Symptoms do not affect his upper ear.  We will cover for otitis externa as well as cellulitis with Ciprodex drops and doxycycline.  He is appropriate for discharge home and was counseled to return to the ED for new worsening symptoms, patient agrees with plan.      ____________________________________________   FINAL CLINICAL IMPRESSION(S) / ED DIAGNOSES  Final diagnoses:  Acute otitis externa of right ear, unspecified type  Cellulitis of right ear     ED Discharge Orders          Ordered    ciprofloxacin-dexamethasone (CIPRODEX) OTIC suspension  2 times daily        06/21/21 1357    doxycycline (VIBRAMYCIN) 100 MG capsule  2 times daily        06/21/21 1357             Note:  This document was prepared using Dragon voice recognition software and may include unintentional dictation errors.    Chesley Noon, MD 06/21/21 1400

## 2021-06-21 NOTE — ED Triage Notes (Signed)
Pt comes into the ED via POV c/o right ear pain x 2 days.  Pt in NAD.

## 2021-09-06 ENCOUNTER — Ambulatory Visit: Payer: Self-pay

## 2022-12-18 ENCOUNTER — Other Ambulatory Visit: Payer: Self-pay

## 2022-12-18 ENCOUNTER — Emergency Department
Admission: EM | Admit: 2022-12-18 | Discharge: 2022-12-18 | Disposition: A | Discharge status: Home / Self Care | Payer: No Typology Code available for payment source | Attending: Emergency Medicine | Admitting: Emergency Medicine

## 2022-12-18 ENCOUNTER — Emergency Department: Payer: No Typology Code available for payment source

## 2022-12-18 DIAGNOSIS — Y92410 Unspecified street and highway as the place of occurrence of the external cause: Secondary | ICD-10-CM | POA: Insufficient documentation

## 2022-12-18 DIAGNOSIS — M542 Cervicalgia: Secondary | ICD-10-CM | POA: Diagnosis not present
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
## 2024-08-01 ENCOUNTER — Emergency Department
Admission: EM | Admit: 2024-08-01 | Discharge: 2024-08-01 | Disposition: A | Discharge status: Home / Self Care | Attending: Emergency Medicine | Admitting: Emergency Medicine

## 2024-08-01 ENCOUNTER — Other Ambulatory Visit: Payer: Self-pay

## 2024-08-01 DIAGNOSIS — S39012A Strain of muscle, fascia and tendon of lower back, initial encounter: Secondary | ICD-10-CM | POA: Insufficient documentation

## 2024-08-01 DIAGNOSIS — Y92410 Unspecified street and highway as the place of occurrence of the external cause: Secondary | ICD-10-CM | POA: Insufficient documentation

## 2024-08-01 DIAGNOSIS — S3992XA Unspecified injury of lower back, initial encounter: Secondary | ICD-10-CM | POA: Diagnosis present

## 2024-08-01 MED ORDER — MELOXICAM 15 MG PO TABS: 15.0000 mg | ORAL_TABLET | Freq: Every day | ORAL | 0 refills | Status: AC

## 2024-08-01 MED ORDER — BACLOFEN 10 MG PO TABS: 10.0000 mg | ORAL_TABLET | Freq: Three times a day (TID) | ORAL | 0 refills | Status: AC
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
